# Patient Record
Sex: Female | Born: 1995 | Race: White | Hispanic: No | Marital: Single | Smoking: Never smoker
Health system: Southern US, Community
[De-identification: ages and names within clinical notes are randomized; demographics above are authoritative.]

---
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
## 2016-10-29 ENCOUNTER — Encounter: Payer: Self-pay | Admitting: Emergency Medicine

## 2016-10-29 ENCOUNTER — Emergency Department: Payer: No Typology Code available for payment source

## 2016-10-29 ENCOUNTER — Emergency Department
Admission: EM | Admit: 2016-10-29 | Discharge: 2016-10-29 | Disposition: A | Discharge status: Home / Self Care | Payer: No Typology Code available for payment source | Attending: Emergency Medicine | Admitting: Emergency Medicine

## 2016-10-29 DIAGNOSIS — R1031 Right lower quadrant pain: Secondary | ICD-10-CM | POA: Diagnosis present

## 2016-10-29 LAB — COMPREHENSIVE METABOLIC PANEL
ALBUMIN: 4.1 g/dL (ref 3.5–5.0)
ALT: 14 U/L (ref 14–54)
ANION GAP: 6 (ref 5–15)
AST: 19 U/L (ref 15–41)
Alkaline Phosphatase: 47 U/L (ref 38–126)
BUN: 14 mg/dL (ref 6–20)
CHLORIDE: 106 mmol/L (ref 101–111)
CO2: 25 mmol/L (ref 22–32)
Calcium: 9.1 mg/dL (ref 8.9–10.3)
Creatinine, Ser: 0.74 mg/dL (ref 0.44–1.00)
GFR calc Af Amer: >60 mL/min (ref >60)
GLUCOSE: 132 mg/dL — AB (ref 65–99)
POTASSIUM: 3.5 mmol/L (ref 3.5–5.1)
Sodium: 137 mmol/L (ref 135–145)
Total Bilirubin: 0.4 mg/dL (ref 0.3–1.2)
Total Protein: 7.9 g/dL (ref 6.5–8.1)

## 2016-10-29 LAB — URINALYSIS COMPLETE WITH MICROSCOPIC (ARMC ONLY)
BACTERIA UA: NONE SEEN
Bilirubin Urine: NEGATIVE
GLUCOSE, UA: NEGATIVE mg/dL
Ketones, ur: NEGATIVE mg/dL
LEUKOCYTES UA: NEGATIVE
Nitrite: NEGATIVE
PH: 5 (ref 5.0–8.0)
PROTEIN: NEGATIVE mg/dL
SPECIFIC GRAVITY, URINE: 1.017 (ref 1.005–1.030)

## 2016-10-29 LAB — CBC
HEMATOCRIT: 39.1 % (ref 35.0–47.0)
HEMOGLOBIN: 13.4 g/dL (ref 12.0–16.0)
MCH: 29.4 pg (ref 26.0–34.0)
MCHC: 34.3 g/dL (ref 32.0–36.0)
MCV: 85.6 fL (ref 80.0–100.0)
Platelets: 209 10*3/uL (ref 150–440)
RBC: 4.56 MIL/uL (ref 3.80–5.20)
RDW: 13.6 % (ref 11.5–14.5)
WBC: 6 10*3/uL (ref 3.6–11.0)

## 2016-10-29 LAB — LIPASE, BLOOD: LIPASE: 34 U/L (ref 11–51)

## 2016-10-29 LAB — POCT PREGNANCY, URINE: PREG TEST UR: NEGATIVE

## 2016-10-29 MED ORDER — IOPAMIDOL (ISOVUE-300) INJECTION 61%
100.0000 mL | Freq: Once | INTRAVENOUS | Status: AC | PRN
  Administered 2016-10-29: 100 mL via INTRAVENOUS
  Filled 2016-10-29: qty 100

## 2016-10-29 MED ORDER — TRAMADOL HCL 50 MG PO TABS: 50.0000 mg | ORAL_TABLET | Freq: Four times a day (QID) | ORAL | 0 refills | Status: AC | PRN

## 2016-10-29 MED ORDER — IOPAMIDOL (ISOVUE-300) INJECTION 61%
30.0000 mL | Freq: Once | INTRAVENOUS | Status: AC | PRN
Start: 2016-10-29 — End: 2016-10-29
  Administered 2016-10-29: 30 mL via ORAL
  Filled 2016-10-29: qty 30

## 2016-10-29 NOTE — ED Notes (Signed)
Patient transported to CT 

## 2016-10-29 NOTE — Discharge Instructions (Signed)
Please take your pain medication as needed, as prescribed. Return to the emergency department for any worsening abdominal pain, fever, or any other symptom personally concerning to yourself.

## 2016-10-29 NOTE — ED Triage Notes (Signed)
Pt presents with pain "very concentrated" in RLQ that began last night. Pt states she has some "very mild" nausea, but denies vomiting/diarrhea. NAD noted.

## 2016-10-29 NOTE — ED Provider Notes (Signed)
[facility] Emergency Department Provider Note  Time seen: 5:25 PM  I have reviewed the triage vital signs and the nursing notes.   HISTORY  Chief Complaint Abdominal Pain    HPI [name] is a 20 y.o. female with no past medical history who presents the emergency department right lower quadrant abdominal pain. According to the patient yesterday she developed a nagging right lower quadrant pain which has become constant today. Describes it as a 6/10 pain, denies nausea or vomiting. Denies diarrhea or dysuria. Denies vaginal bleeding or discharge. Denies fever. States the pain is worse with any type of movement bump or stretching.  History reviewed. No pertinent past medical history.  There are no active problems to display for this patient.   History reviewed. No pertinent surgical history.  Prior to Admission medications   Not on File    No Known Allergies  History reviewed. No pertinent family history.  Social History Social History  Substance Use Topics  . Smoking status: Never Smoker  . Smokeless tobacco: Never Used  . Alcohol use No    Review of Systems Constitutional: Negative for fever Cardiovascular: Negative for chest pain. Respiratory: Negative for shortness of breath. Gastrointestinal: Right lower quadrant abdominal pain. Negative for nausea, vomiting, diarrhea Genitourinary: Negative for dysuria. Musculoskeletal: Negative for back pain Neurological: Negative for headache 10-point ROS otherwise negative.  ____________________________________________   PHYSICAL EXAM:  VITAL SIGNS: ED Triage Vitals  Enc Vitals Group     BP 10/29/16 1707 121/74     Pulse Rate 10/29/16 1707 92     Resp 10/29/16 1707 20     Temp 10/29/16 1707 98.4 F (36.9 C)     Temp Source 10/29/16 1707 Oral     SpO2 10/29/16 1707 100 %     Weight 10/29/16 1709 125 lb (56.7 kg)     Height 10/29/16 1709 5\' 4"  (1.626 m)     Head Circumference  --      Peak Flow --      Pain Score 10/29/16 1710 5     Pain Loc --      Pain Edu? --      Excl. in GC? --     Constitutional: Alert and oriented. Well appearing and in no distress. Eyes: Normal exam ENT   Head: Normocephalic and atraumatic.   Mouth/Throat: Mucous membranes are moist. Cardiovascular: Normal rate, regular rhythm. No murmur Respiratory: Normal respiratory effort without tachypnea nor retractions. Breath sounds are clear  Gastrointestinal: Soft, moderate right lower quadrant abdominal tenderness to palpation, moderate suprapubic tenderness. No rebound or guarding. No distention. Musculoskeletal: Nontender with normal range of motion in all extremities.  Neurologic:  Normal speech and language. No gross focal neurologic deficits  Skin:  Skin is warm, dry and intact.  Psychiatric: Mood and affect are normal. Speech and behavior are normal.   ____________________________________________     RADIOLOGY  CT negative  ____________________________________________   INITIAL IMPRESSION / ASSESSMENT AND PLAN / ED COURSE  Pertinent labs & imaging results that were available during my care of the patient were reviewed by me and considered in my medical decision making (see chart for details).  The patient presents the emergency department for right lower quadrant abdominal pain since yesterday. States it has become constant today rated as a 6/10 moderate dull aching pain. Patient denies vaginal bleeding or discharge. Denies dysuria. We will check labs, proceed with a CT the abdomen/pelvis to further evaluate and rule out appendicitis.  Labs are within normal limits. The patient's CT scan is negative. Patient will be discharged with pain medication to be taken as needed for the next several days. Patient will follow up with her primary care doctor. I discussed strict return precautions for any fever or worsening  pain.  ____________________________________________   FINAL CLINICAL IMPRESSION(S) / ED DIAGNOSES  Right lower quadrant abdominal pain    Minna AntisKevin Harleyquinn Gasser, MD 10/29/16 1850

## 2017-12-15 IMAGING — CT CT ABD-PELV W/ CM
2 of 4 series · 16 of 46 positions shown, 18 images · IV contrast (iopamidol)
Comparison: None.

CLINICAL DATA: Right lower quadrant pain beginning yesterday.
Nausea.

EXAM:
CT ABDOMEN AND PELVIS WITH CONTRAST
TECHNIQUE: Multidetector CT imaging of the abdomen and pelvis was performed
using the standard protocol following bolus administration of
intravenous contrast.
CONTRAST:  100mL 4CCLK3-RAA IOPAMIDOL (4CCLK3-RAA) INJECTION 61%

[Series 2: axial st · axial · 0.68mm/px · z∈[-1050,-615]mm · 13 of 95 slices shown, 15 images]
[im 4/95  soft-tissue]
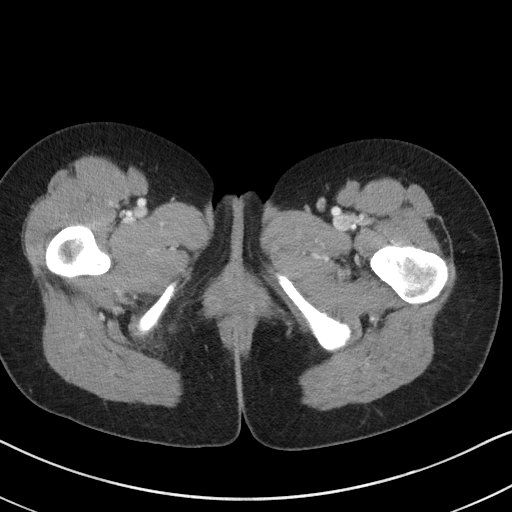
[im 4/95  bone]
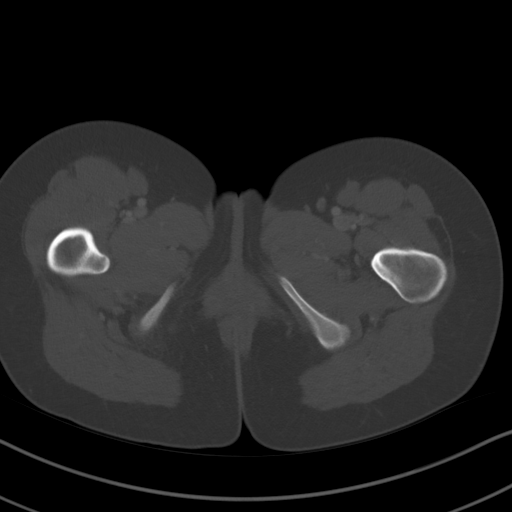
[im 12/95  soft-tissue]
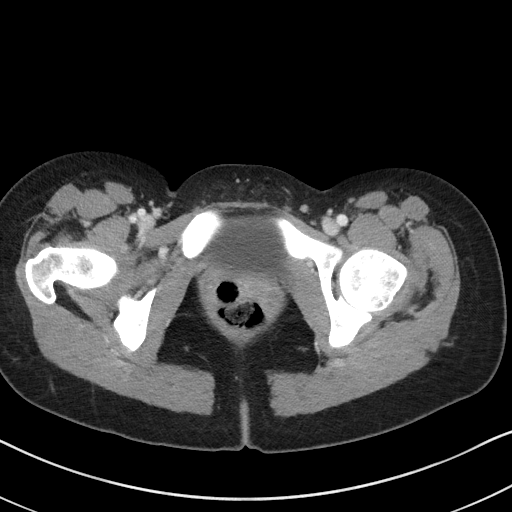
[im 19/95  soft-tissue]
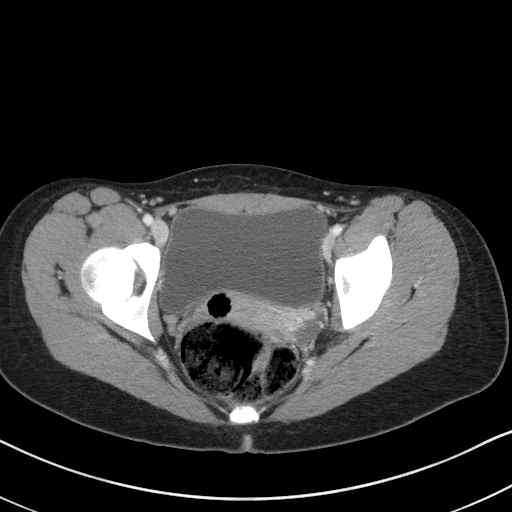
[im 27/95  soft-tissue]
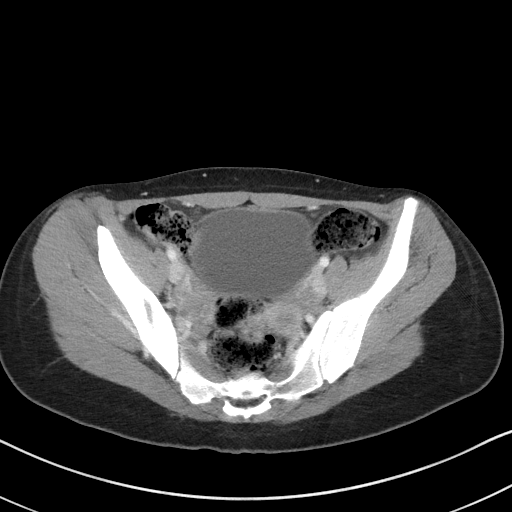
[im 34/95  soft-tissue]
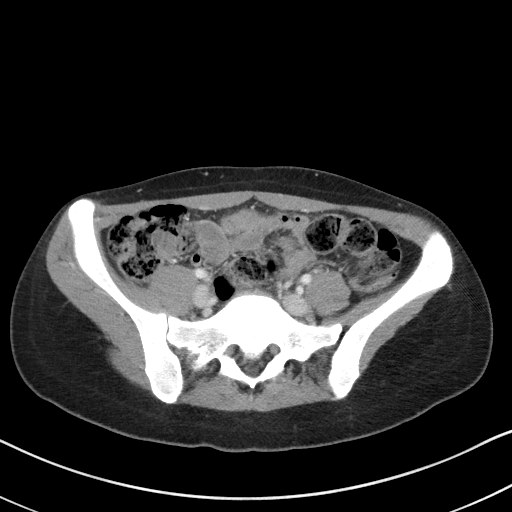
[im 42/95  soft-tissue]
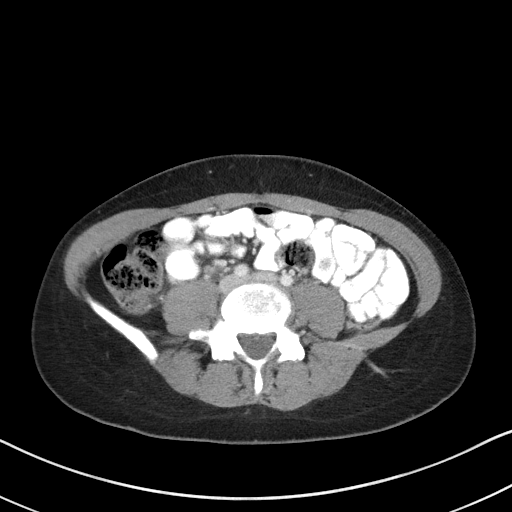
[im 49/95  soft-tissue]
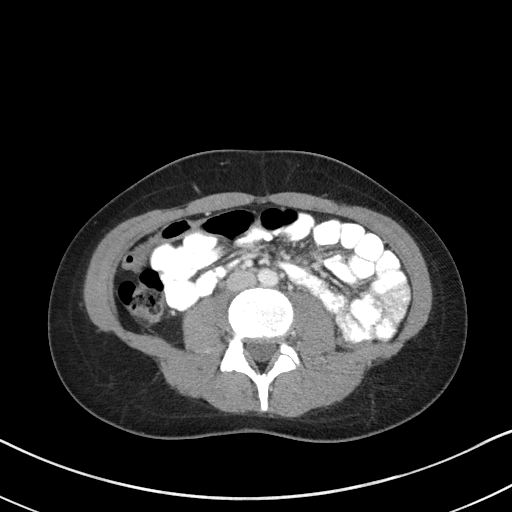
[im 53/95  soft-tissue]
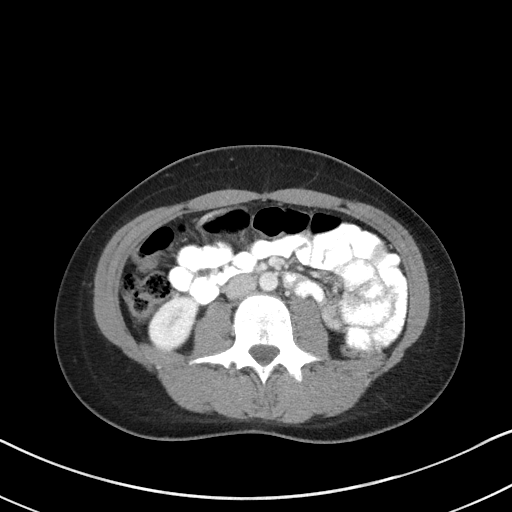
[im 61/95  soft-tissue]
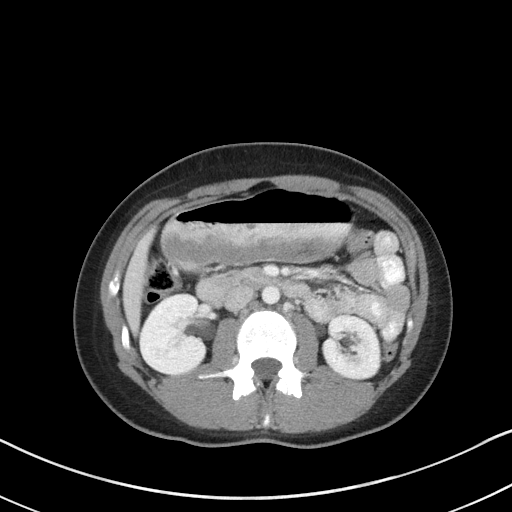
[im 61/95  bone]
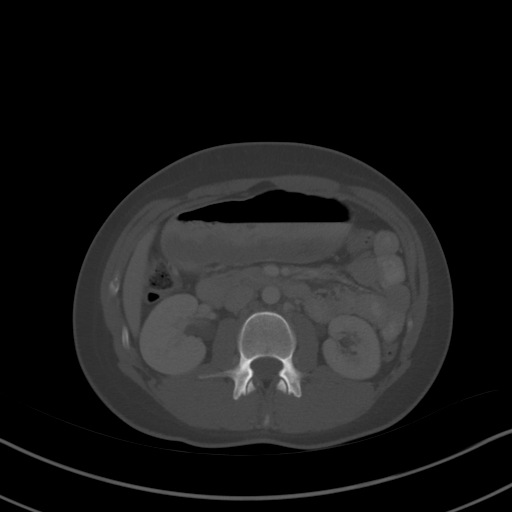
[im 68/95  soft-tissue]
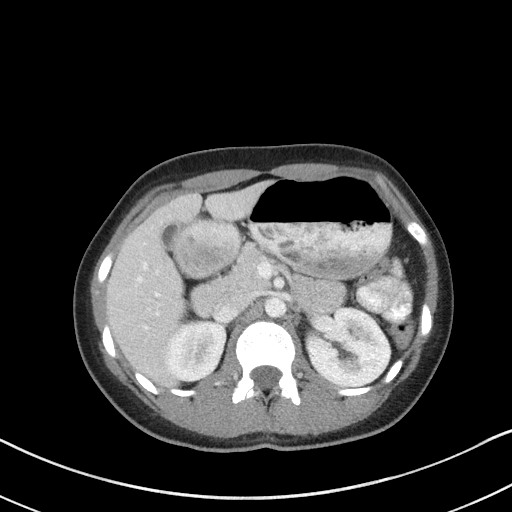
[im 76/95  soft-tissue]
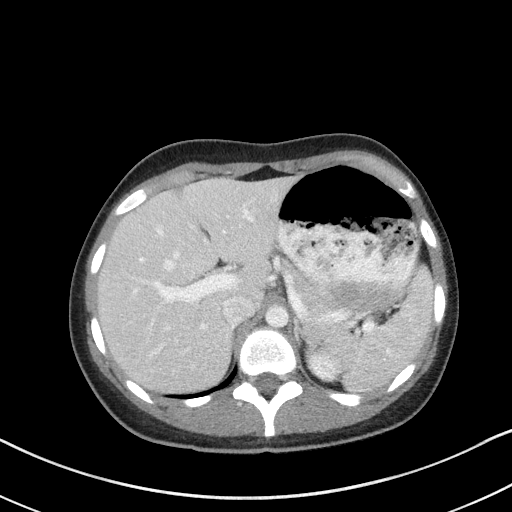
[im 83/95  soft-tissue]
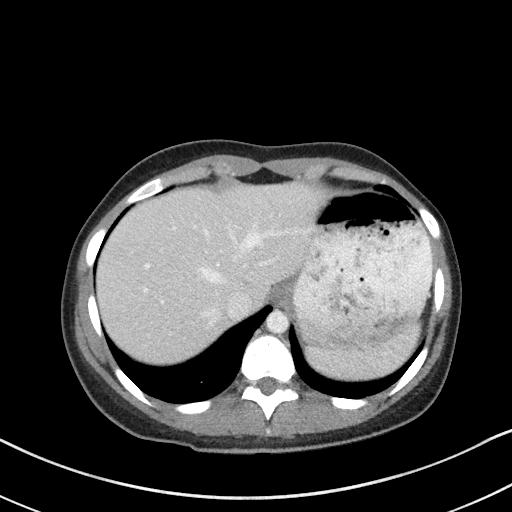
[im 91/95  soft-tissue]
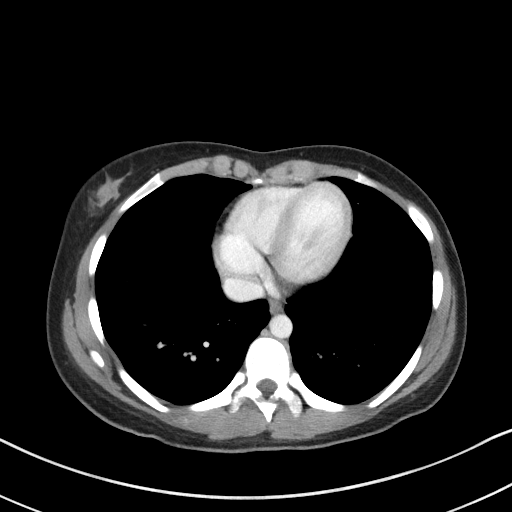

[Series 5: coronal st · coronal · 0.61mm/px · 3 of 68 slices shown]
[im 23/68  soft-tissue]
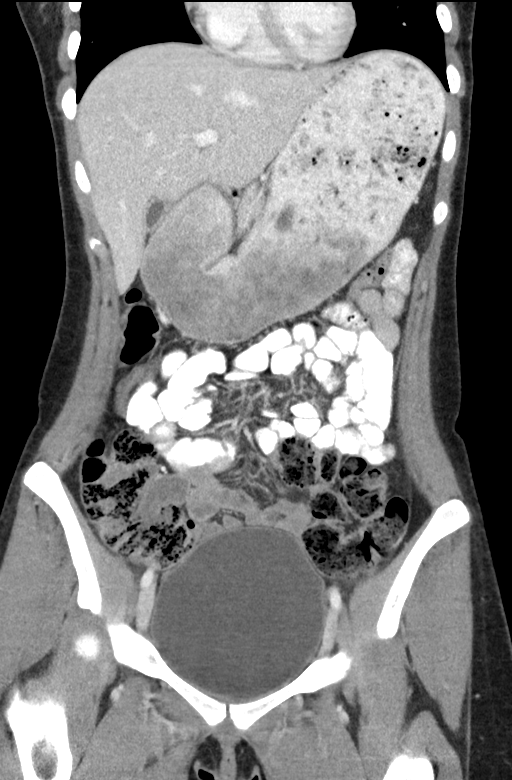
[im 30/68  soft-tissue]
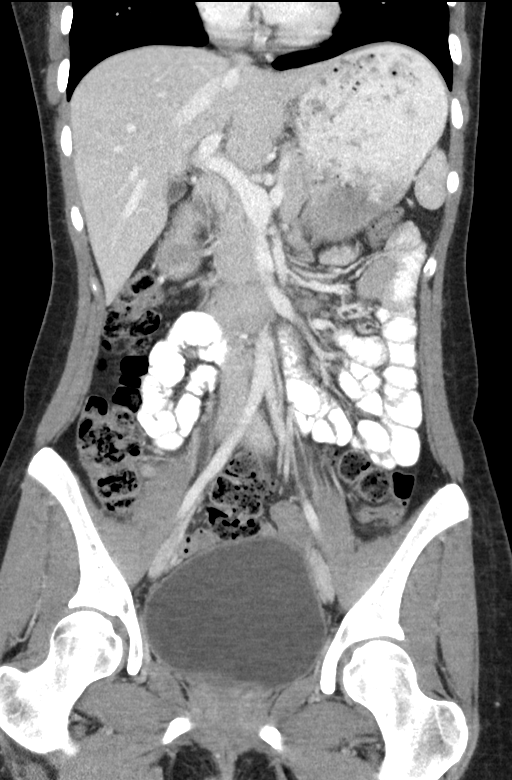
[im 38/68  soft-tissue]
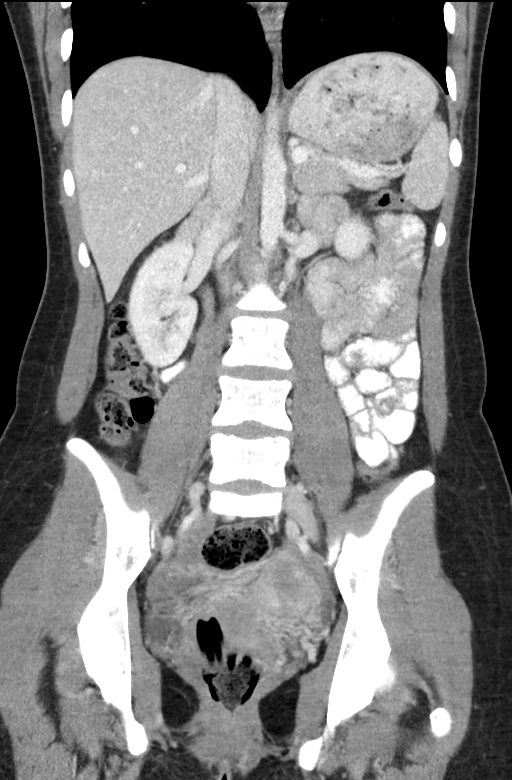

[16 of 46 positions shown; findings below may reference images not displayed]

FINDINGS: Lower Chest: No acute findings.

Hepatobiliary:  No masses identified. Gallbladder is unremarkable.

Pancreas:  No mass or inflammatory changes.

Spleen: Within normal limits in size and appearance.

Adrenals/Urinary Tract: No masses identified. No evidence of
hydronephrosis.

Stomach/Bowel: No evidence of obstruction, inflammatory process or
abnormal fluid collections. Normal appendix visualized.

Vascular/Lymphatic: No pathologically enlarged lymph nodes. No
abdominal aortic aneurysm.

Reproductive: Unremarkable uterus and adnexa. No mass or
inflammatory process identified.

Other:  None.

Musculoskeletal:  No suspicious bone lesions identified.
IMPRESSION: Negative.  No evidence of appendicitis or other acute findings.
